# Patient Record
Sex: Male | Born: 1970 | Race: White | Hispanic: No | Marital: Married | Smoking: Current every day smoker
Health system: Southern US, Community
[De-identification: ages and names within clinical notes are randomized; demographics above are authoritative.]

---

## 2002-08-29 ENCOUNTER — Emergency Department (HOSPITAL_COMMUNITY): Admission: EM | Admit: 2002-08-29 | Discharge: 2002-08-29 | Payer: Self-pay

## 2006-11-03 ENCOUNTER — Emergency Department (HOSPITAL_COMMUNITY): Admission: EM | Admit: 2006-11-03 | Discharge: 2006-11-03 | Payer: Self-pay | Admitting: Emergency Medicine

## 2020-01-28 ENCOUNTER — Encounter (HOSPITAL_BASED_OUTPATIENT_CLINIC_OR_DEPARTMENT_OTHER): Payer: Self-pay | Admitting: Emergency Medicine

## 2020-01-28 ENCOUNTER — Emergency Department (HOSPITAL_BASED_OUTPATIENT_CLINIC_OR_DEPARTMENT_OTHER)
Admission: EM | Admit: 2020-01-28 | Discharge: 2020-01-29 | Disposition: A | Discharge status: Home / Self Care | Payer: Self-pay | Attending: Emergency Medicine | Admitting: Emergency Medicine

## 2020-01-28 ENCOUNTER — Emergency Department (HOSPITAL_BASED_OUTPATIENT_CLINIC_OR_DEPARTMENT_OTHER): Payer: Self-pay

## 2020-01-28 ENCOUNTER — Other Ambulatory Visit: Payer: Self-pay

## 2020-01-28 DIAGNOSIS — F17290 Nicotine dependence, other tobacco product, uncomplicated: Secondary | ICD-10-CM | POA: Insufficient documentation

## 2020-01-28 DIAGNOSIS — R112 Nausea with vomiting, unspecified: Secondary | ICD-10-CM | POA: Insufficient documentation

## 2020-01-28 DIAGNOSIS — K567 Ileus, unspecified: Secondary | ICD-10-CM | POA: Insufficient documentation

## 2020-01-28 DIAGNOSIS — R1011 Right upper quadrant pain: Secondary | ICD-10-CM | POA: Insufficient documentation

## 2020-01-28 LAB — COMPREHENSIVE METABOLIC PANEL
ALT: 22 U/L (ref 0–44)
AST: 16 U/L (ref 15–41)
Albumin: 4.6 g/dL (ref 3.5–5.0)
Alkaline Phosphatase: 83 U/L (ref 38–126)
Anion gap: 16 — ABNORMAL HIGH (ref 5–15)
BUN: 21 mg/dL — ABNORMAL HIGH (ref 6–20)
CO2: 22 mmol/L (ref 22–32)
Calcium: 9.3 mg/dL (ref 8.9–10.3)
Chloride: 95 mmol/L — ABNORMAL LOW (ref 98–111)
Creatinine, Ser: 1.13 mg/dL (ref 0.61–1.24)
GFR calc Af Amer: >60 mL/min (ref >60)
GFR calc non Af Amer: >60 mL/min (ref >60)
Glucose, Bld: 115 mg/dL — ABNORMAL HIGH (ref 70–99)
Potassium: 3.5 mmol/L (ref 3.5–5.1)
Sodium: 133 mmol/L — ABNORMAL LOW (ref 135–145)
Total Bilirubin: 1.6 mg/dL — ABNORMAL HIGH (ref 0.3–1.2)
Total Protein: 7.8 g/dL (ref 6.5–8.1)

## 2020-01-28 LAB — URINALYSIS, MICROSCOPIC (REFLEX): RBC / HPF: NONE SEEN RBC/hpf (ref 0–5)

## 2020-01-28 LAB — URINALYSIS, ROUTINE W REFLEX MICROSCOPIC
Glucose, UA: NEGATIVE mg/dL
Hgb urine dipstick: NEGATIVE
Ketones, ur: >80 mg/dL — AB
Leukocytes,Ua: NEGATIVE
Nitrite: NEGATIVE
Protein, ur: 30 mg/dL — AB
Specific Gravity, Urine: 1.025 (ref 1.005–1.030)
pH: 6 (ref 5.0–8.0)

## 2020-01-28 LAB — CBC
HCT: 47.6 % (ref 39.0–52.0)
Hemoglobin: 16.3 g/dL (ref 13.0–17.0)
MCH: 30.1 pg (ref 26.0–34.0)
MCHC: 34.2 g/dL (ref 30.0–36.0)
MCV: 87.8 fL (ref 80.0–100.0)
Platelets: 343 10*3/uL (ref 150–400)
RBC: 5.42 MIL/uL (ref 4.22–5.81)
RDW: 12.3 % (ref 11.5–15.5)
WBC: 13.1 10*3/uL — ABNORMAL HIGH (ref 4.0–10.5)
nRBC: 0 % (ref 0.0–0.2)

## 2020-01-28 LAB — LIPASE, BLOOD: Lipase: 30 U/L (ref 11–51)

## 2020-01-28 MED ORDER — MORPHINE SULFATE (PF) 4 MG/ML IV SOLN
4.0000 mg | Freq: Once | INTRAVENOUS | Status: AC
  Administered 2020-01-28: 4 mg via INTRAVENOUS
  Filled 2020-01-28: qty 1

## 2020-01-28 MED ORDER — ONDANSETRON 4 MG PO TBDP: 4.0000 mg | ORAL_TABLET | Freq: Three times a day (TID) | ORAL | 0 refills | Status: AC | PRN

## 2020-01-28 MED ORDER — SODIUM CHLORIDE 0.9 % IV BOLUS
1000.0000 mL | Freq: Once | INTRAVENOUS | Status: AC
  Administered 2020-01-28: 1000 mL via INTRAVENOUS

## 2020-01-28 MED ORDER — IOHEXOL 300 MG/ML  SOLN
100.0000 mL | Freq: Once | INTRAMUSCULAR | Status: AC | PRN
  Administered 2020-01-28: 100 mL via INTRAVENOUS

## 2020-01-28 MED ORDER — ONDANSETRON HCL 4 MG/2ML IJ SOLN
4.0000 mg | Freq: Once | INTRAMUSCULAR | Status: AC
  Administered 2020-01-28: 4 mg via INTRAVENOUS
  Filled 2020-01-28: qty 2

## 2020-01-28 MED ORDER — ACETAMINOPHEN ER 650 MG PO TBCR: 650.0000 mg | EXTENDED_RELEASE_TABLET | Freq: Three times a day (TID) | ORAL | 0 refills | Status: AC | PRN

## 2020-01-28 NOTE — ED Provider Notes (Signed)
[facility] EMERGENCY DEPARTMENT Provider Note   CSN: [id number] Arrival date & time: 01/28/20  1631     History Chief Complaint  Patient presents with  . Abdominal Pain    Peter Stout is a 49 y.o. male with no significant past medical history who presents to the ED due to right upper quadrant and mid abdominal pain that has been persistent for the past 5 days.  Patient rates his pain a 7/10 and describes pain as a constant dull sensation.  Abdominal pain associated with nausea and vomiting.  Patient admits to numerous episodes of nonbloody, nonbilious emesis mostly after any oral intake.  Patient notes he is unable to keep any food down.  Unsure when his last bowel movement was, but notes his BMs are typically irregular.  No previous abdominal operations.  Denies fever and chills.  Patient admits to smoking marijuana frequently.  Denies diarrhea.  No recent antibiotics or undercooked foods.  Denies sick contacts and Covid exposures.  He has tried Haematologist with mild relief.  He was seen at [facility] a few days ago with reassuring work-up.  History obtained from patient and past medical records. No interpreter used during encounter.      History reviewed. No pertinent past medical history.  There are no problems to display for this patient.   History reviewed. No pertinent surgical history.     History reviewed. No pertinent family history.  Social History   Tobacco Use  . Smoking status: Current Every Day Smoker  Substance Use Topics  . Alcohol use: Not Currently  . Drug use: Yes    Types: Marijuana    Home Medications Prior to Admission medications   Medication Sig Start Date End Date Taking? Authorizing Provider  acetaminophen (TYLENOL 8 HOUR) 650 MG CR tablet Take 1 tablet (650 mg total) by mouth every 8 (eight) hours as needed for pain. 01/28/20   Mannie Stabile, PA-C  ondansetron (ZOFRAN ODT) 4 MG disintegrating tablet Take 1  tablet (4 mg total) by mouth every 8 (eight) hours as needed for nausea or vomiting. 01/28/20   Mannie Stabile, PA-C    Allergies    Patient has no allergy information on record.  Review of Systems   Review of Systems  Constitutional: Negative for chills and fever.  Respiratory: Negative for shortness of breath.   Cardiovascular: Negative for chest pain.  Gastrointestinal: Positive for abdominal pain, nausea and vomiting. Negative for diarrhea.  Genitourinary: Negative for dysuria.  Musculoskeletal: Negative for back pain.  All other systems reviewed and are negative.   Physical Exam Updated Vital Signs BP 117/68 (BP Location: Right Arm)   Pulse (!) 55   Temp 98.7 F (37.1 C) (Oral)   Resp 17   Ht 5\' 11"  (1.803 m)   Wt 63.5 kg   SpO2 98%   BMI 19.53 kg/m   Physical Exam Vitals and nursing note reviewed.  Constitutional:      General: He is not in acute distress.    Appearance: He is not ill-appearing.  HENT:     Head: Normocephalic.  Eyes:     Pupils: Pupils are equal, round, and reactive to light.  Cardiovascular:     Rate and Rhythm: Normal rate and regular rhythm.     Pulses: Normal pulses.     Heart sounds: Normal heart sounds. No murmur heard.  No friction rub. No gallop.   Pulmonary:     Effort: Pulmonary effort is  normal.     Breath sounds: Normal breath sounds.  Abdominal:     General: Abdomen is flat. Bowel sounds are normal. There is no distension.     Palpations: Abdomen is soft.     Tenderness: There is abdominal tenderness. There is no guarding or rebound.     Comments: Tenderness to palpation in right upper quadrant, epigastric region, and around umbilicus.  No rebound or guarding.  Negative CVA tenderness bilaterally.  Musculoskeletal:     Cervical back: Neck supple.     Comments: Able to move all 4 extremities without difficulty.   Skin:    General: Skin is warm and dry.  Neurological:     General: No focal deficit present.     Mental  Status: He is alert.  Psychiatric:        Mood and Affect: Mood normal.        Behavior: Behavior normal.     ED Results / Procedures / Treatments   Labs (all labs ordered are listed, but only abnormal results are displayed) Labs Reviewed  COMPREHENSIVE METABOLIC PANEL - Abnormal; Notable for the following components:      Result Value   Sodium 133 (*)    Chloride 95 (*)    Glucose, Bld 115 (*)    BUN 21 (*)    Total Bilirubin 1.6 (*)    Anion gap 16 (*)    All other components within normal limits  CBC - Abnormal; Notable for the following components:   WBC 13.1 (*)    All other components within normal limits  URINALYSIS, ROUTINE W REFLEX MICROSCOPIC - Abnormal; Notable for the following components:   APPearance CLOUDY (*)    Bilirubin Urine SMALL (*)    Ketones, ur >80 (*)    Protein, ur 30 (*)    All other components within normal limits  URINALYSIS, MICROSCOPIC (REFLEX) - Abnormal; Notable for the following components:   Bacteria, UA MANY (*)    All other components within normal limits  URINE CULTURE  LIPASE, BLOOD    EKG None  Radiology CT ABDOMEN PELVIS W CONTRAST  Result Date: 01/28/2020 CLINICAL DATA:  Right upper quadrant to mid upper abdominal pain for 5 days, decreased appetite, nausea and vomiting EXAM: CT ABDOMEN AND PELVIS WITH CONTRAST TECHNIQUE: Multidetector CT imaging of the abdomen and pelvis was performed using the standard protocol following bolus administration of intravenous contrast. CONTRAST:  OMNIPAQUE IOHEXOL 300 MG/ML  SOLN COMPARISON:  01/28/2020 FINDINGS: Lower chest: No acute pleural or parenchymal lung disease. Hepatobiliary: No focal liver abnormality is seen. No gallstones, gallbladder wall thickening, or biliary dilatation. Pancreas: Unremarkable. No pancreatic ductal dilatation or surrounding inflammatory changes. Spleen: Normal in size without focal abnormality. Adrenals/Urinary Tract: Adrenal glands are unremarkable. Kidneys are  normal, without renal calculi, focal lesion, or hydronephrosis. Bladder is unremarkable. Stomach/Bowel: There are several mildly distended loops of jejunum within the left mid abdomen, measuring up to 3.7 cm in maximal diameter. No definite bowel wall thickening. No evidence of high-grade obstruction. High density material throughout the colon compatible with recent ingestion of bismuth salts (Pepto-Bismol). Normal appendix right lower quadrant. Vascular/Lymphatic: No significant vascular findings are present. No enlarged abdominal or pelvic lymph nodes. Reproductive: Prostate is unremarkable. Other: No free fluid or free gas.  No abdominal wall hernia. Musculoskeletal: No acute or destructive bony lesions. Reconstructed images demonstrate no additional findings. IMPRESSION: 1. Mildly distended gas-filled loops of jejunum within the left mid abdomen, which may reflect ileus.  No high-grade obstruction. 2. Otherwise no acute intra-abdominal or intrapelvic process. Electronically Signed   By: Sharlet Salina M.D.   On: 01/28/2020 20:56   US Abdomen Limited RUQ  Result Date: 01/28/2020 CLINICAL DATA:  Nausea, vomiting and generalized abdominal pain for 5 days EXAM: ULTRASOUND ABDOMEN LIMITED RIGHT UPPER QUADRANT COMPARISON:  None. FINDINGS: Gallbladder: No gallstones or wall thickening visualized. No sonographic Murphy sign noted by sonographer. Common bile duct: Diameter: 3 mm, nondilated Liver: No focal lesion identified. Within normal limits in parenchymal echogenicity. Portal vein is patent on color Doppler imaging with normal direction of blood flow towards the liver. Other: None. IMPRESSION: Unremarkable right upper quadrant ultrasound. Electronically Signed   By: Kreg Shropshire M.D.   On: 01/28/2020 19:08    Procedures Procedures (including critical care time)  Medications Ordered in ED Medications  sodium chloride 0.9 % bolus 1,000 mL ( Intravenous Stopped 01/28/20 1937)  morphine 4 MG/ML injection 4 mg  (4 mg Intravenous Given 01/28/20 1817)  ondansetron (ZOFRAN) injection 4 mg (4 mg Intravenous Given 01/28/20 1816)  iohexol (OMNIPAQUE) 300 MG/ML solution 100 mL (100 mLs Intravenous Contrast Given 01/28/20 2038)    ED Course  I have reviewed the triage vital signs and the nursing notes.  Pertinent labs & imaging results that were available during my care of the patient were reviewed by me and considered in my medical decision making (see chart for details).  Clinical Course as of Jan 27 2250  Sat Jan 28, 2020  1750 Lipase: 30 [CA]  1750 WBC(!): 13.1 [CA]  1750 Sodium(!): 133 [CA]  1750 Glucose(!): 115 [CA]  1750 Anion gap(!): 16 [CA]    Clinical Course User Index [CA] Mannie Stabile, PA-C   MDM Rules/Calculators/A&P                         49 year old male presents to the ED due to right upper quadrant, epigastric, and umbilicus abdominal pain that has been persistent for the past 5 days associated with nausea, vomiting, and decreased appetite.  Denies fever and chills.  Denies chronic alcohol and NSAID use.  Admits to smoking marijuana frequently.  Upon arrival, patient afebrile, not tachycardic or hypoxic.  Mild elevation in BP at 139/118 likely due to pain.  We will continue to monitor.  Patient in no acute distress and nontoxic-appearing.  Abdomen soft, nondistended with tenderness palpation right upper quadrant, epigastric, and area surrounding the umbilicus.  No right lower quadrant or left lower quadrant abdominal pain.  No rebound or guarding.  Negative CVA tenderness bilaterally.  Routine labs ordered at triage.  Will give IV fluids, morphine, and Zofran for symptomatic relief.  Will also obtain right upper quadrant ultrasound.  If right upper quadrant ultrasound is unremarkable may need CT scan to rule out any acute abnormalities.  CBC significant for mild leukocytosis at 13.1.  Lipase normal at 30.  Doubt pancreatitis.  CMP significant for mild hyponatremia 133 and  hyperglycemia at 115 with a slight anion gap of 16.  UA significant for ketonuria and proteinuria likely due to dehydration.  No signs of infection. Urine culture pending. RUQ [place] personally reviewed which is negative for any acute abnormalities.  8:13 PM reassessed patient at bedside who notes continued abdominal pain.  Given right upper quadrant ultrasound is normal, will obtain CT abdomen to rule out any intra-abdominal abnormalities.  Patient denies current nausea.  CT abdomen personally reviewed which demonstrates: IMPRESSION:  1. Mildly distended  gas-filled loops of jejunum within the left mid  abdomen, which may reflect ileus. No high-grade obstruction.  2. Otherwise no acute intra-abdominal or intrapelvic process.   Shared decision making in regards to hospital admission for ileus vs. Bowel rest at home and patient would prefer to go home. Patient able to tolerate liquids here in the ED without difficulty. I had a long discuss about bowel rest with patient. Patient given information on bowel rest. Will discharge patient with tylenol for abdominal pain and zofran as needed for nausea. Instructed patient to return to the ER for worsening abdominal pain, inability to tolerate any po, or worsening of symptoms. Strict ED precautions discussed with patient. Patient states understanding and agrees to plan. Patient discharged home in no acute distress and stable vitals  Discussed case with Dr. Rex Kras who agrees with assessment and plan.   Final Clinical Impression(s) / ED Diagnoses Final diagnoses:  RUQ pain  Ileus ([place])    Rx / DC Orders ED Discharge Orders         Ordered    ondansetron (ZOFRAN ODT) 4 MG disintegrating tablet  Every 8 hours PRN     Discontinue  Reprint     01/28/20 2251    acetaminophen (TYLENOL 8 HOUR) 650 MG CR tablet  Every 8 hours PRN     Discontinue  Reprint     01/28/20 2251           Suzy Bouchard, PA-C 01/28/20 2253    Little, Wenda Overland,  MD 01/30/20 1557

## 2020-01-28 NOTE — ED Triage Notes (Signed)
Pt arrives EMS c/o RUQ to mid quadrant abdominal pain x 5 days. Pt also endorses decreased appetite with N/V

## 2020-01-28 NOTE — ED Notes (Signed)
Lab aware of add on urine culture.

## 2020-01-28 NOTE — Discharge Instructions (Signed)
As discussed, your labs were reassuring today. Your CT scan showed an ileus which is when your intestines are not moving as they should. The treatment for an ileus is bowel rest. I have included information on clear liquid diet. Only eat clear liquids for the next few days and then begin to progress your diet. I am sending you home with Tylenol and nausea medication. Return to the ER if you are unable to tolerate any fluids or worsening pain. Follow-up with PCP if symptoms do not improve within the next week.

## 2020-01-30 LAB — URINE CULTURE: Culture: <10000 — AB

## 2021-05-03 IMAGING — US US ABDOMEN LIMITED
1 series · 14 of 25 positions shown · non-contrast
Comparison: None.

CLINICAL DATA: Nausea, vomiting and generalized abdominal pain for
5 days

EXAM:
ULTRASOUND ABDOMEN LIMITED RIGHT UPPER QUADRANT

[Series 1: us abdomen limited · 14 of 38 slices shown]
[im 1/38]
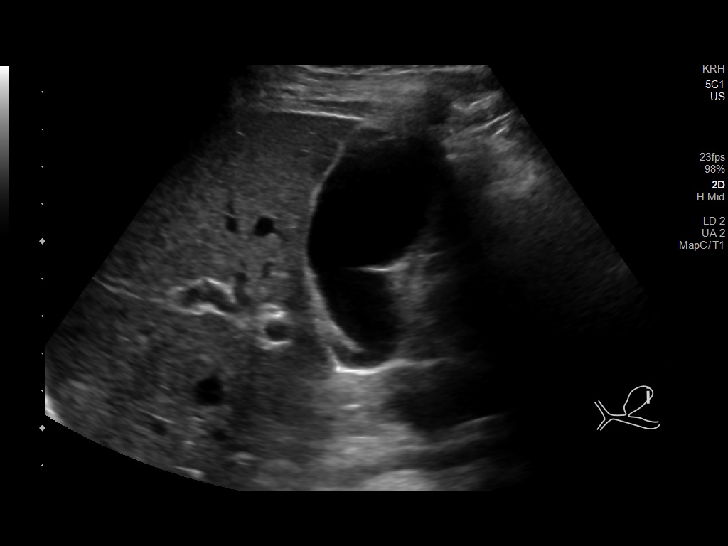
[im 4/38]
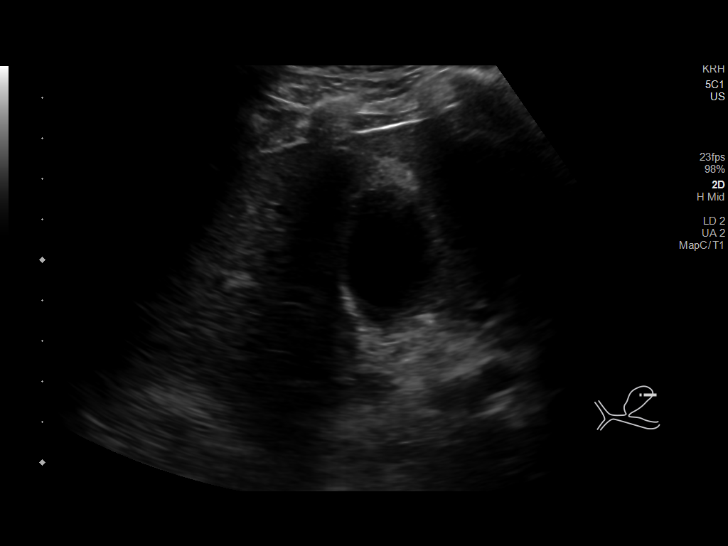
[im 7/38]
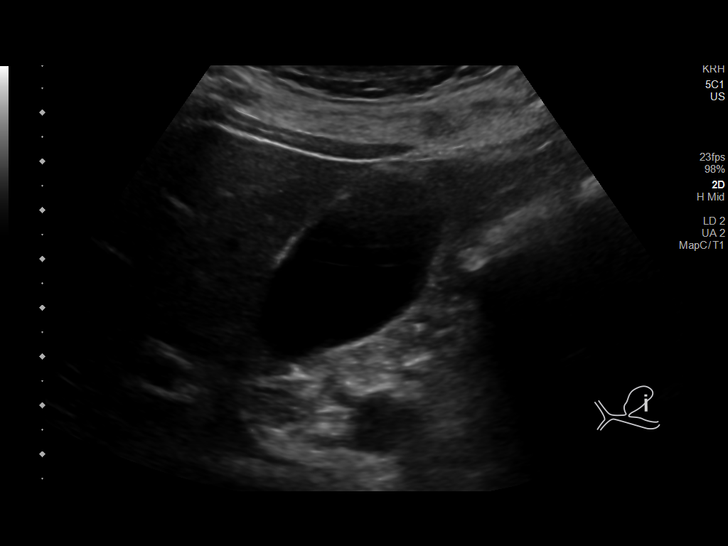
[im 10/38]
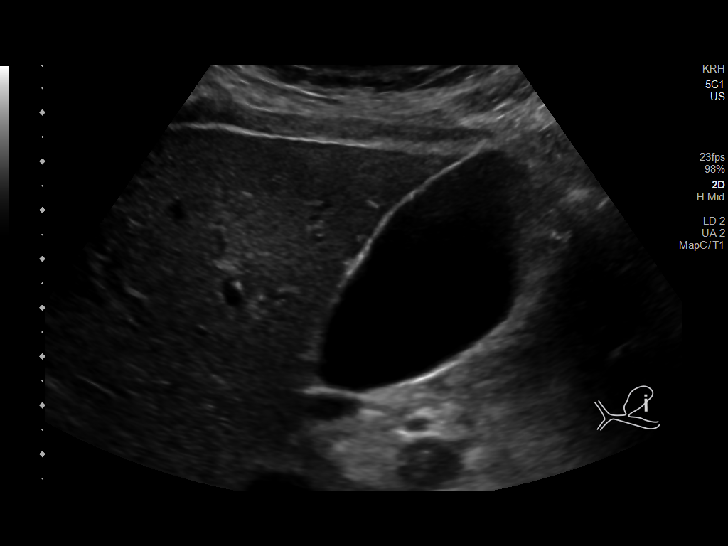
[im 13/38]
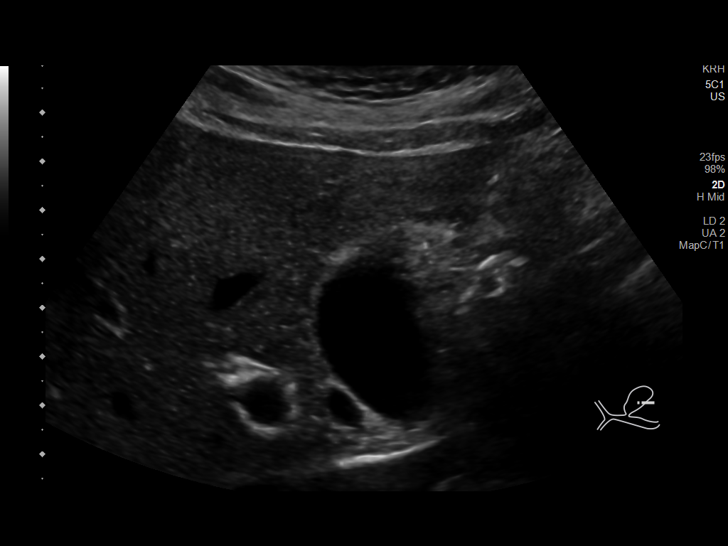
[im 14/38]
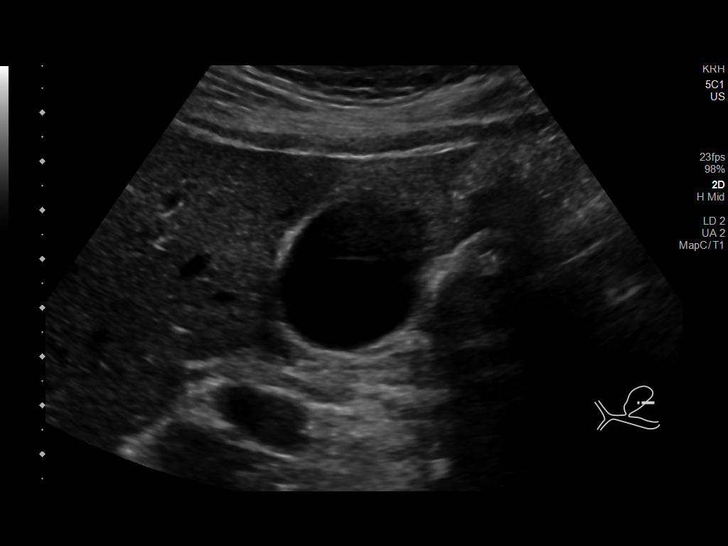
[im 17/38]
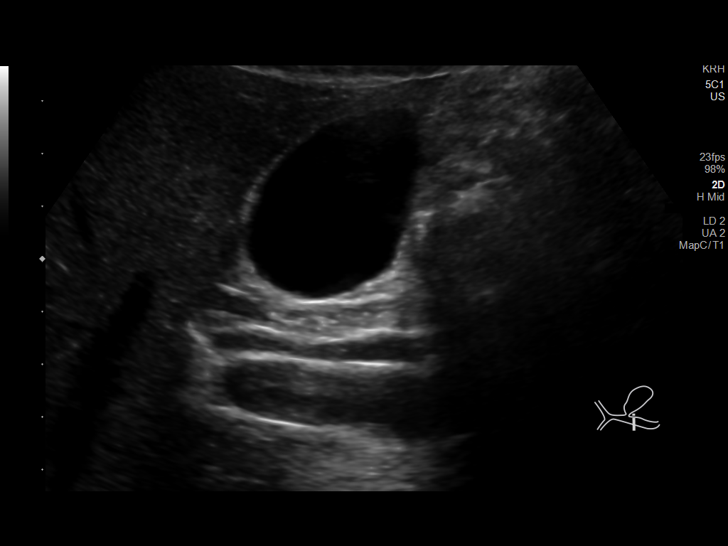
[im 21/38]
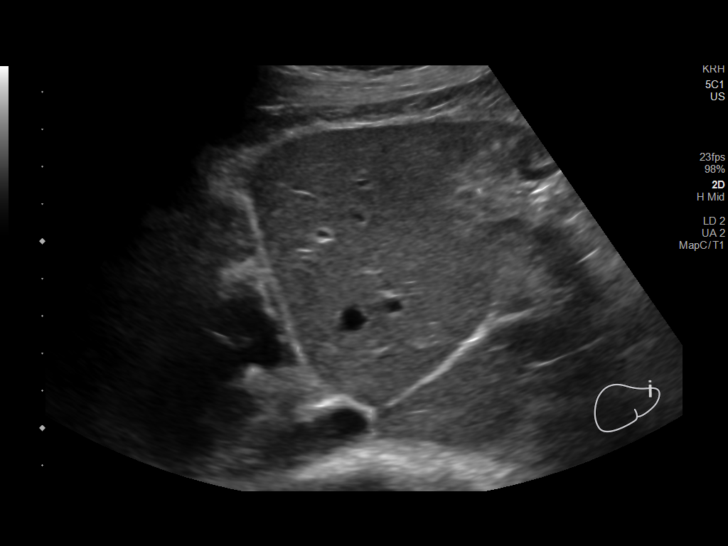
[im 24/38]
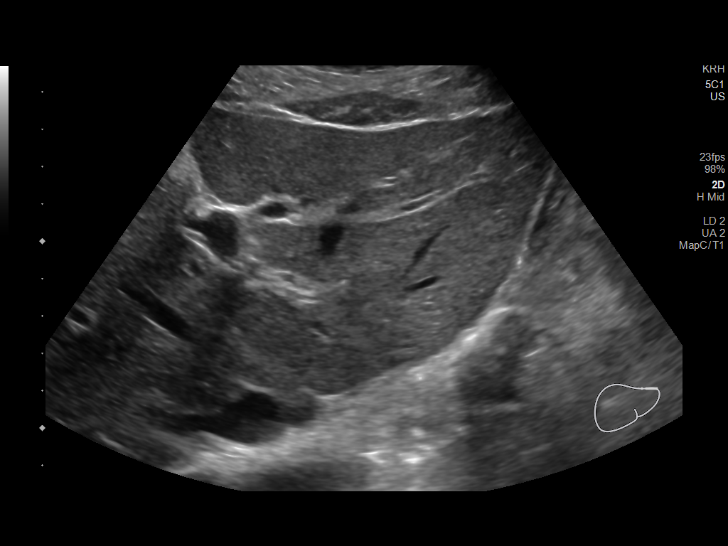
[im 25/38]
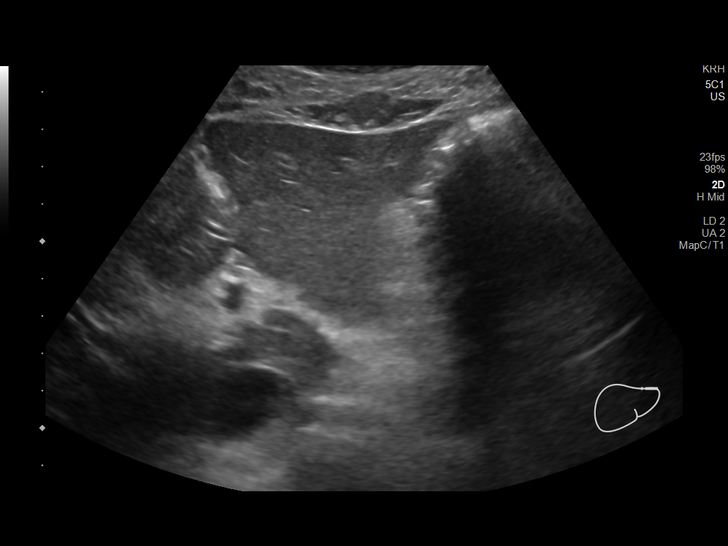
[im 28/38]
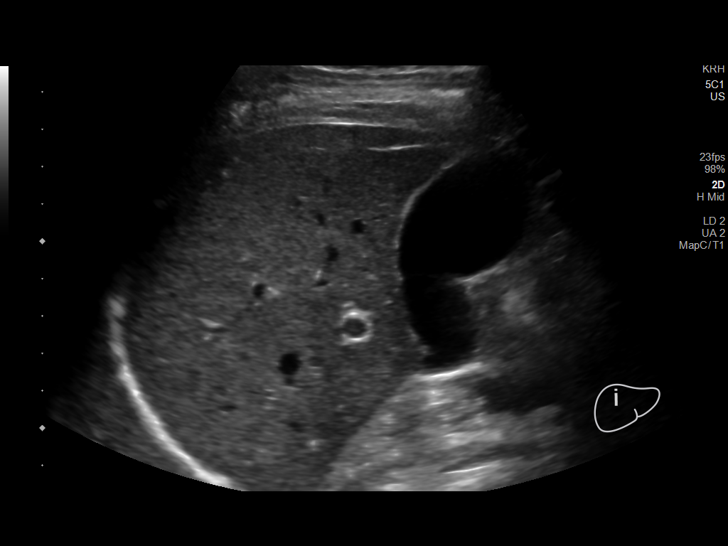
[im 31/38]
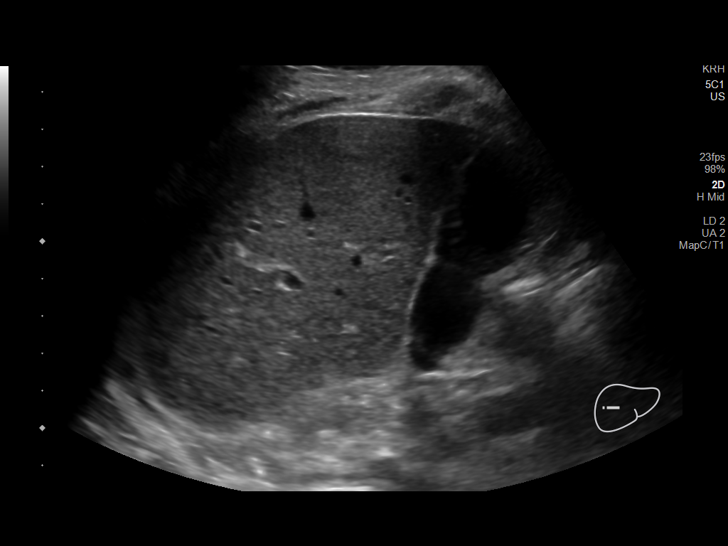
[im 34/38]
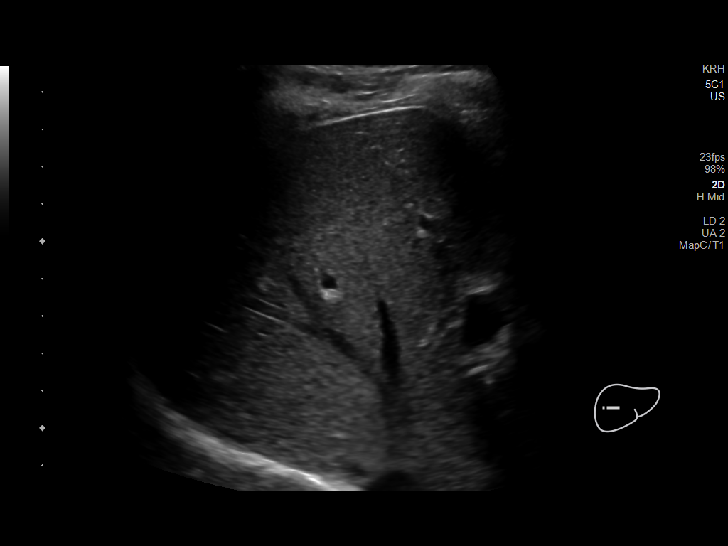
[im 38/38]
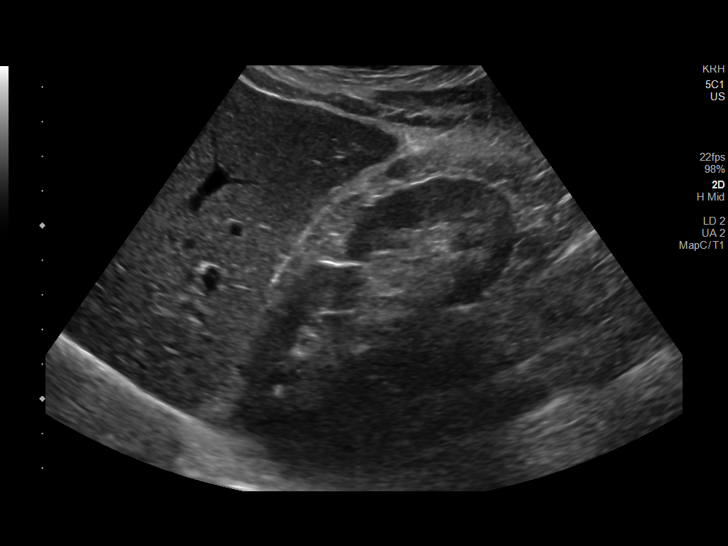

[14 of 25 positions shown; findings below may reference images not displayed]

FINDINGS: Gallbladder:

No gallstones or wall thickening visualized. No sonographic Murphy
sign noted by sonographer.

Common bile duct:

Diameter: 3 mm, nondilated

Liver:

No focal lesion identified. Within normal limits in parenchymal
echogenicity. Portal vein is patent on color Doppler imaging with
normal direction of blood flow towards the liver.

Other: None.
IMPRESSION: Unremarkable right upper quadrant ultrasound.
